# Patient Record
Sex: Male | Born: 1990 | Race: Black or African American | Hispanic: No | Marital: Single | Smoking: Current every day smoker
Health system: Southern US, Community
[De-identification: ages and names within clinical notes are randomized; demographics above are authoritative.]

---
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
## 2019-08-27 ENCOUNTER — Other Ambulatory Visit: Payer: Self-pay

## 2019-08-27 DIAGNOSIS — Z20822 Contact with and (suspected) exposure to covid-19: Secondary | ICD-10-CM

## 2019-08-28 LAB — NOVEL CORONAVIRUS, NAA: SARS-CoV-2, NAA: NOT DETECTED

## 2021-07-06 ENCOUNTER — Encounter (HOSPITAL_BASED_OUTPATIENT_CLINIC_OR_DEPARTMENT_OTHER): Payer: Self-pay

## 2021-07-06 ENCOUNTER — Emergency Department (HOSPITAL_BASED_OUTPATIENT_CLINIC_OR_DEPARTMENT_OTHER): Payer: Self-pay | Admitting: Radiology

## 2021-07-06 ENCOUNTER — Emergency Department (HOSPITAL_BASED_OUTPATIENT_CLINIC_OR_DEPARTMENT_OTHER)
Admission: EM | Admit: 2021-07-06 | Discharge: 2021-07-06 | Disposition: A | Discharge status: Home / Self Care | Payer: Self-pay | Attending: Emergency Medicine | Admitting: Emergency Medicine

## 2021-07-06 ENCOUNTER — Other Ambulatory Visit: Payer: Self-pay

## 2021-07-06 DIAGNOSIS — U071 COVID-19: Secondary | ICD-10-CM | POA: Insufficient documentation

## 2021-07-06 DIAGNOSIS — R000 Tachycardia, unspecified: Secondary | ICD-10-CM | POA: Insufficient documentation

## 2021-07-06 DIAGNOSIS — R451 Restlessness and agitation: Secondary | ICD-10-CM | POA: Insufficient documentation

## 2021-07-06 DIAGNOSIS — F17210 Nicotine dependence, cigarettes, uncomplicated: Secondary | ICD-10-CM | POA: Insufficient documentation

## 2021-07-06 LAB — CBC WITH DIFFERENTIAL/PLATELET
Abs Immature Granulocytes: 0.01 10*3/uL (ref 0.00–0.07)
Basophils Absolute: 0 10*3/uL (ref 0.0–0.1)
Basophils Relative: 1 %
Eosinophils Absolute: 0 10*3/uL (ref 0.0–0.5)
Eosinophils Relative: 0 %
HCT: 46.9 % (ref 39.0–52.0)
Hemoglobin: 15.9 g/dL (ref 13.0–17.0)
Immature Granulocytes: 0 %
Lymphocytes Relative: 10 %
Lymphs Abs: 0.6 10*3/uL — ABNORMAL LOW (ref 0.7–4.0)
MCH: 27.2 pg (ref 26.0–34.0)
MCHC: 33.9 g/dL (ref 30.0–36.0)
MCV: 80.3 fL (ref 80.0–100.0)
Monocytes Absolute: 1.3 10*3/uL — ABNORMAL HIGH (ref 0.1–1.0)
Monocytes Relative: 24 %
Neutro Abs: 3.5 10*3/uL (ref 1.7–7.7)
Neutrophils Relative %: 65 %
Platelets: 180 10*3/uL (ref 150–400)
RBC: 5.84 MIL/uL — ABNORMAL HIGH (ref 4.22–5.81)
RDW: 13.7 % (ref 11.5–15.5)
WBC: 5.3 10*3/uL (ref 4.0–10.5)
nRBC: 0 % (ref 0.0–0.2)

## 2021-07-06 LAB — TROPONIN I (HIGH SENSITIVITY): Troponin I (High Sensitivity): 3 ng/L (ref <18)

## 2021-07-06 LAB — URINALYSIS, ROUTINE W REFLEX MICROSCOPIC
Bilirubin Urine: NEGATIVE
Glucose, UA: NEGATIVE mg/dL
Hgb urine dipstick: NEGATIVE
Ketones, ur: NEGATIVE mg/dL
Leukocytes,Ua: NEGATIVE
Nitrite: NEGATIVE
Specific Gravity, Urine: 1.027 (ref 1.005–1.030)
pH: 7 (ref 5.0–8.0)

## 2021-07-06 LAB — RAPID URINE DRUG SCREEN, HOSP PERFORMED
Amphetamines: NOT DETECTED
Barbiturates: NOT DETECTED
Benzodiazepines: NOT DETECTED
Cocaine: NOT DETECTED
Opiates: NOT DETECTED
Tetrahydrocannabinol: POSITIVE — AB

## 2021-07-06 LAB — COMPREHENSIVE METABOLIC PANEL
ALT: 13 U/L (ref 0–44)
AST: 16 U/L (ref 15–41)
Albumin: 4.6 g/dL (ref 3.5–5.0)
Alkaline Phosphatase: 52 U/L (ref 38–126)
Anion gap: 12 (ref 5–15)
BUN: 11 mg/dL (ref 6–20)
CO2: 25 mmol/L (ref 22–32)
Calcium: 10.1 mg/dL (ref 8.9–10.3)
Chloride: 103 mmol/L (ref 98–111)
Creatinine, Ser: 0.99 mg/dL (ref 0.61–1.24)
GFR, Estimated: >60 mL/min (ref >60)
Glucose, Bld: 108 mg/dL — ABNORMAL HIGH (ref 70–99)
Potassium: 3.5 mmol/L (ref 3.5–5.1)
Sodium: 140 mmol/L (ref 135–145)
Total Bilirubin: 0.6 mg/dL (ref 0.3–1.2)
Total Protein: 7.7 g/dL (ref 6.5–8.1)

## 2021-07-06 LAB — RESP PANEL BY RT-PCR (FLU A&B, COVID) ARPGX2
Influenza A by PCR: NEGATIVE
Influenza B by PCR: NEGATIVE
SARS Coronavirus 2 by RT PCR: POSITIVE — AB

## 2021-07-06 LAB — D-DIMER, QUANTITATIVE: D-Dimer, Quant: 0.33 ug/mL-FEU (ref 0.00–0.50)

## 2021-07-06 MED ORDER — ALBUTEROL SULFATE HFA 108 (90 BASE) MCG/ACT IN AERS
2.0000 | INHALATION_SPRAY | RESPIRATORY_TRACT | Status: DC | PRN
  Administered 2021-07-06: 2 via RESPIRATORY_TRACT
  Filled 2021-07-06: qty 6.7

## 2021-07-06 MED ORDER — LORAZEPAM 1 MG PO TABS
2.0000 mg | ORAL_TABLET | Freq: Once | ORAL | Status: AC
  Administered 2021-07-06: 2 mg via ORAL
  Filled 2021-07-06: qty 2

## 2021-07-06 NOTE — ED Notes (Signed)
Patient cursing at nurse stating "I can't mother fucking breathe I came here for help so help me and quit talking to me".  Advised we will not take verbal abuse and we are trying to coach you to slow your breathing down because that is why you are feeling the numbness and tingling.

## 2021-07-06 NOTE — ED Provider Notes (Signed)
[facility] EMERGENCY DEPT Provider Note   CSN: [id number] Arrival date & time: 07/06/21  1820     History Chief Complaint  Patient presents with   Shortness of Breath    Bobby Lang is a 30 y.o. male.  The patient states that he has pain in his right chest, and this pain is creating difficulty breathing.  He states that he is not truly short of breath but rather it is the pain that is compromising his ability to breathe.   Chest Pain Pain location:  R chest Pain quality: sharp   Pain radiates to:  Does not radiate Pain severity:  Severe Onset quality:  Sudden Duration:  3 hours Timing:  Constant Progression:  Unchanged Chronicity:  New Context: at rest   Relieved by:  Nothing Worsened by:  Deep breathing Ineffective treatments:  None tried Associated symptoms: shortness of breath   Associated symptoms: no abdominal pain, no back pain, no cough, no fever, no palpitations and no vomiting   Risk factors: smoking       History reviewed. No pertinent past medical history.  There are no problems to display for this patient.   History reviewed. No pertinent surgical history.     No family history on file.  Social History   Tobacco Use   Smoking status: Every Day    Types: Cigarettes   Smokeless tobacco: Never  Substance Use Topics   Alcohol use: Never   Drug use: Never    Home Medications Prior to Admission medications   Not on File    Allergies    Patient has no known allergies.  Review of Systems   Review of Systems  Constitutional:  Negative for chills and fever.  HENT:  Negative for ear pain and sore throat.   Eyes:  Negative for pain and visual disturbance.  Respiratory:  Positive for shortness of breath. Negative for cough.   Cardiovascular:  Positive for chest pain. Negative for palpitations.  Gastrointestinal:  Negative for abdominal pain and vomiting.  Genitourinary:  Negative for dysuria and hematuria.   Musculoskeletal:  Negative for arthralgias and back pain.  Skin:  Negative for color change and rash.  Neurological:  Negative for seizures and syncope.  All other systems reviewed and are negative.  Physical Exam Updated Vital Signs BP (!) 130/97 (BP Location: Right Arm)   Pulse 93   Temp 99.5 F (37.5 C)   Resp 18   SpO2 100%   Physical Exam Constitutional:      Comments: Agitated, sweaty  HENT:     Head: Normocephalic and atraumatic.     Mouth/Throat:     Mouth: Mucous membranes are moist.  Cardiovascular:     Rate and Rhythm: Regular rhythm. Tachycardia present.     Heart sounds: Normal heart sounds.  Pulmonary:     Effort: Pulmonary effort is normal.     Breath sounds: Normal breath sounds.  Abdominal:     Palpations: Abdomen is soft. There is no mass.     Tenderness: There is no abdominal tenderness.  Musculoskeletal:        General: Normal range of motion.  Skin:    General: Skin is warm.  Neurological:     General: No focal deficit present.  Psychiatric:        Behavior: Behavior is agitated.    ED Results / Procedures / Treatments   Labs (all labs ordered are listed, but only abnormal results are displayed) Labs Reviewed  RESP PANEL  BY RT-PCR (FLU A&B, COVID) ARPGX2 - Abnormal; Notable for the following components:      Result Value   SARS Coronavirus 2 by RT PCR POSITIVE (*)    All other components within normal limits  URINALYSIS, ROUTINE W REFLEX MICROSCOPIC - Abnormal; Notable for the following components:   Protein, ur TRACE (*)    All other components within normal limits  RAPID URINE DRUG SCREEN, HOSP PERFORMED - Abnormal; Notable for the following components:   Tetrahydrocannabinol POSITIVE (*)    All other components within normal limits  CBC WITH DIFFERENTIAL/PLATELET - Abnormal; Notable for the following components:   RBC 5.84 (*)    Lymphs Abs 0.6 (*)    Monocytes Absolute 1.3 (*)    All other components within normal limits   COMPREHENSIVE METABOLIC PANEL - Abnormal; Notable for the following components:   Glucose, Bld 108 (*)    All other components within normal limits  D-DIMER, QUANTITATIVE  TROPONIN I (HIGH SENSITIVITY)  TROPONIN I (HIGH SENSITIVITY)    EKG EKG Interpretation  Date/Time:  Thursday July 06 2021 18:28:09 EDT Ventricular Rate:  106 PR Interval:  181 QRS Duration: 99 QT Interval:  328 QTC Calculation: 436 R Axis:   81 Text Interpretation: Sinus tachycardia ST elev, probable normal early repol pattern normal axis No acute ischemia Confirmed by Pieter Partridge ([id number]) on 07/06/2021 6:37:57 PM  Radiology DG Chest 2 View  Result Date: 07/06/2021 CLINICAL DATA:  Shortness of breath EXAM: CHEST - 2 VIEW COMPARISON:  09/10/2008 FINDINGS: Cardiac shadow is within normal limits. The lungs are well aerated bilaterally. Diffuse interstitial changes are noted bilaterally likely representing an atypical pneumonia. No focal confluent infiltrate is seen. No sizable effusion is noted. IMPRESSION: Diffuse interstitial changes which may represent bronchitis or atypical pneumonia. Electronically Signed   By: Alcide Clever M.D.   On: 07/06/2021 19:10    Procedures Procedures   Medications Ordered in ED Medications  albuterol (VENTOLIN HFA) 108 (90 Base) MCG/ACT inhaler 2 puff (has no administration in time range)  LORazepam (ATIVAN) tablet 2 mg (has no administration in time range)    ED Course  I have reviewed the triage vital signs and the nursing notes.  Pertinent labs & imaging results that were available during my care of the patient were reviewed by me and considered in my medical decision making (see chart for details).    MDM Rules/Calculators/A&P                           Bobby Lang presented complaining of right-sided chest pain and difficulty breathing.  He was quite anxious upon arrival and was also agitated and angry.  Initial differential diagnosis included pneumothorax, pneumonia,  sympathomimetic toxicity or other toxin, ACS.  He was given Ativan with good response.  X-ray revealed bilateral interstitial pneumonia at which point I added a COVID-19 test.  This was positive, and this is likely the explanation for his symptoms.  He is young, healthy, and at low risk for serious complications.  He does not meet criteria for Paxlovid treatment.  Return precautions given. Final Clinical Impression(s) / ED Diagnoses Final diagnoses:  COVID-19    Rx / DC Orders ED Discharge Orders     None        Koleen Distance, MD 07/06/21 2105

## 2021-07-06 NOTE — ED Notes (Signed)
This RN was called to Room concerning Patient wishing to leave without completing care. Patient made aware that they are advised to wait and complete care and be informed by EDP concerning Results. Patient states he will think about it. Staff will continue to monitor.

## 2021-07-06 NOTE — ED Triage Notes (Signed)
Patient walked into the lobby stating "I can't breathe" and then threw himself in the floor.  Advised patient he is hyperventilating and patient continues to get angry and state "it hurts to breathe". Patient complaining of pain with inspiration so hyperventilating causing numbness to extremities and face.

## 2022-10-23 IMAGING — DX DG CHEST 2V
2 series · 2 of 2 positions shown · non-contrast
Comparison: 09/10/2008

CLINICAL DATA: Shortness of breath

EXAM:
CHEST - 2 VIEW

[chest pa]
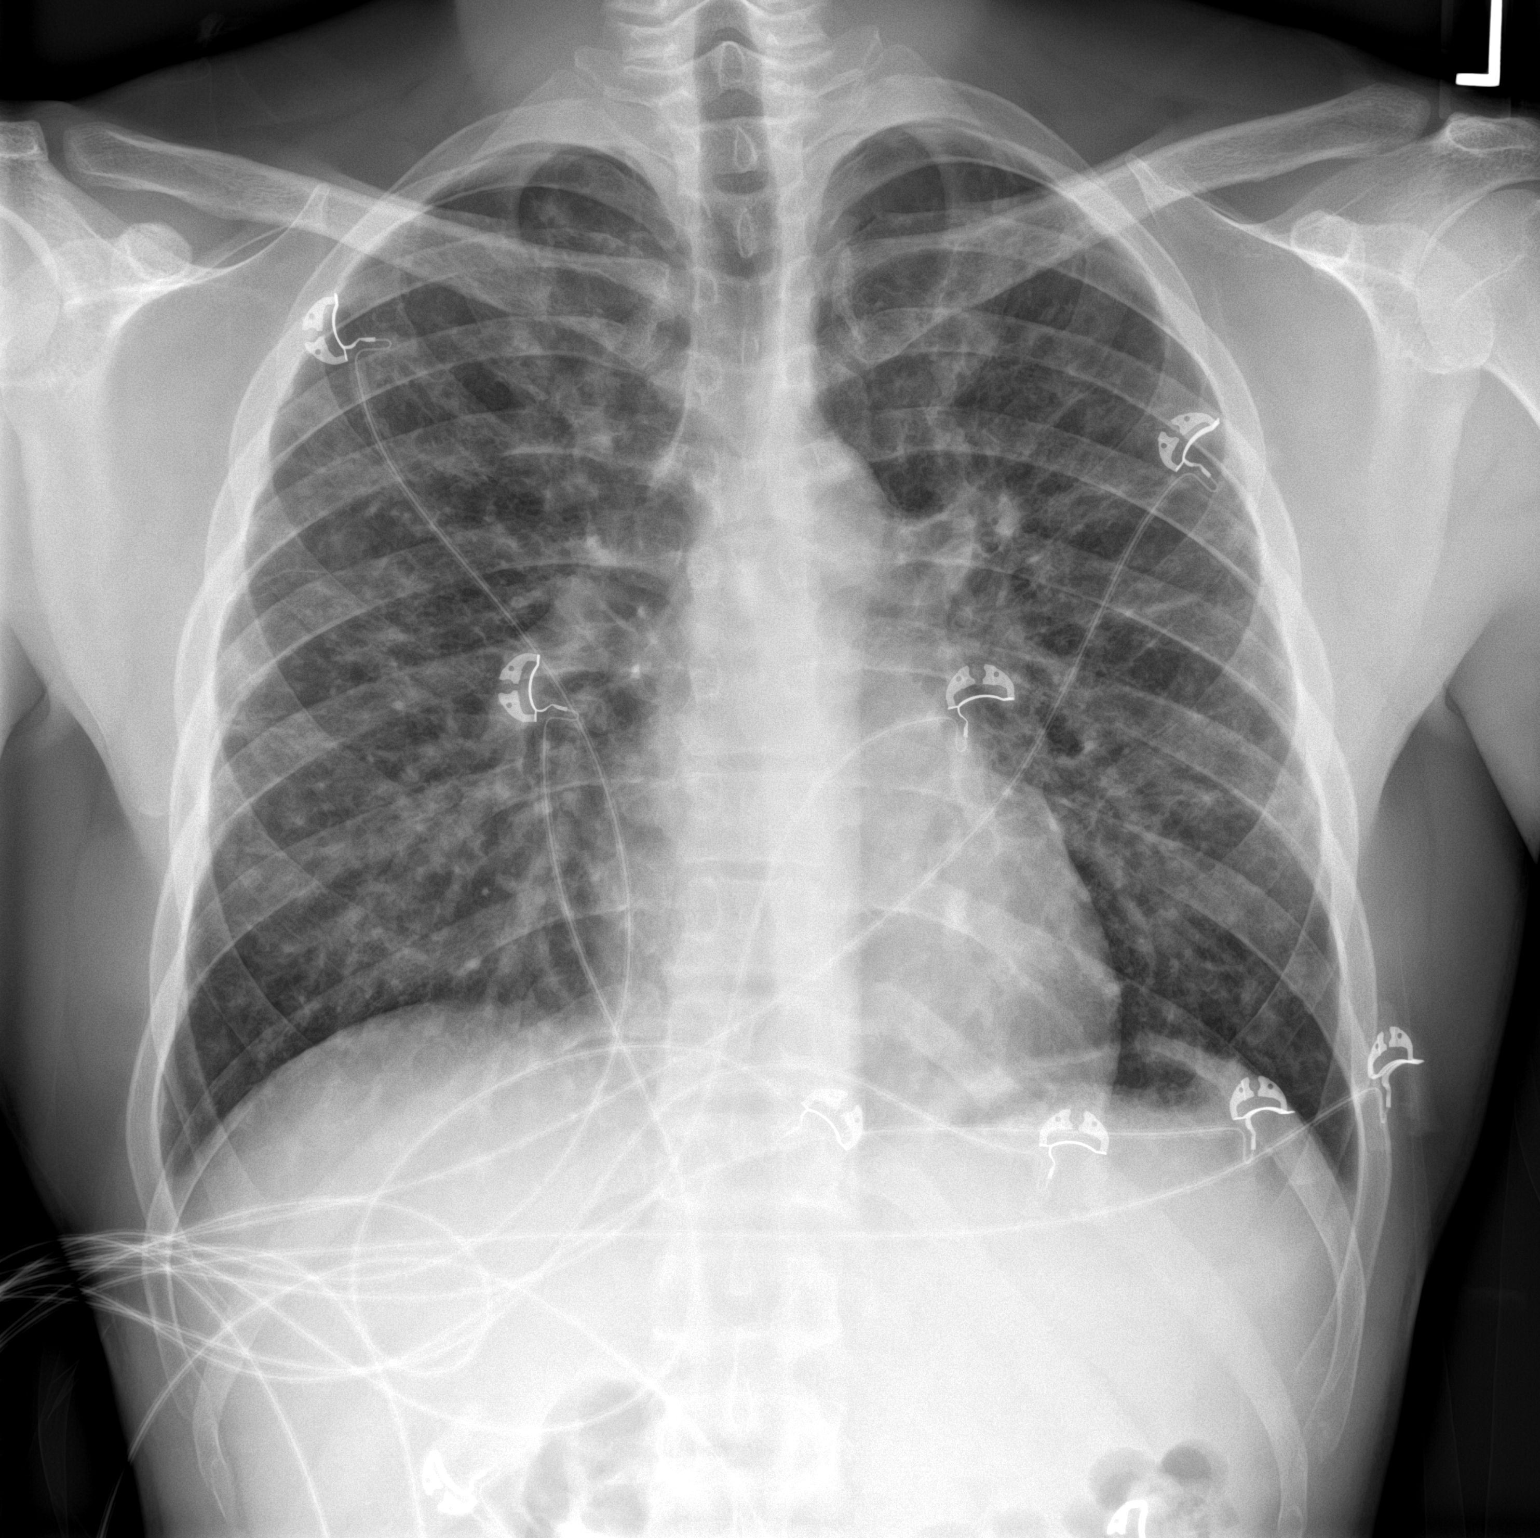

[chest lat]
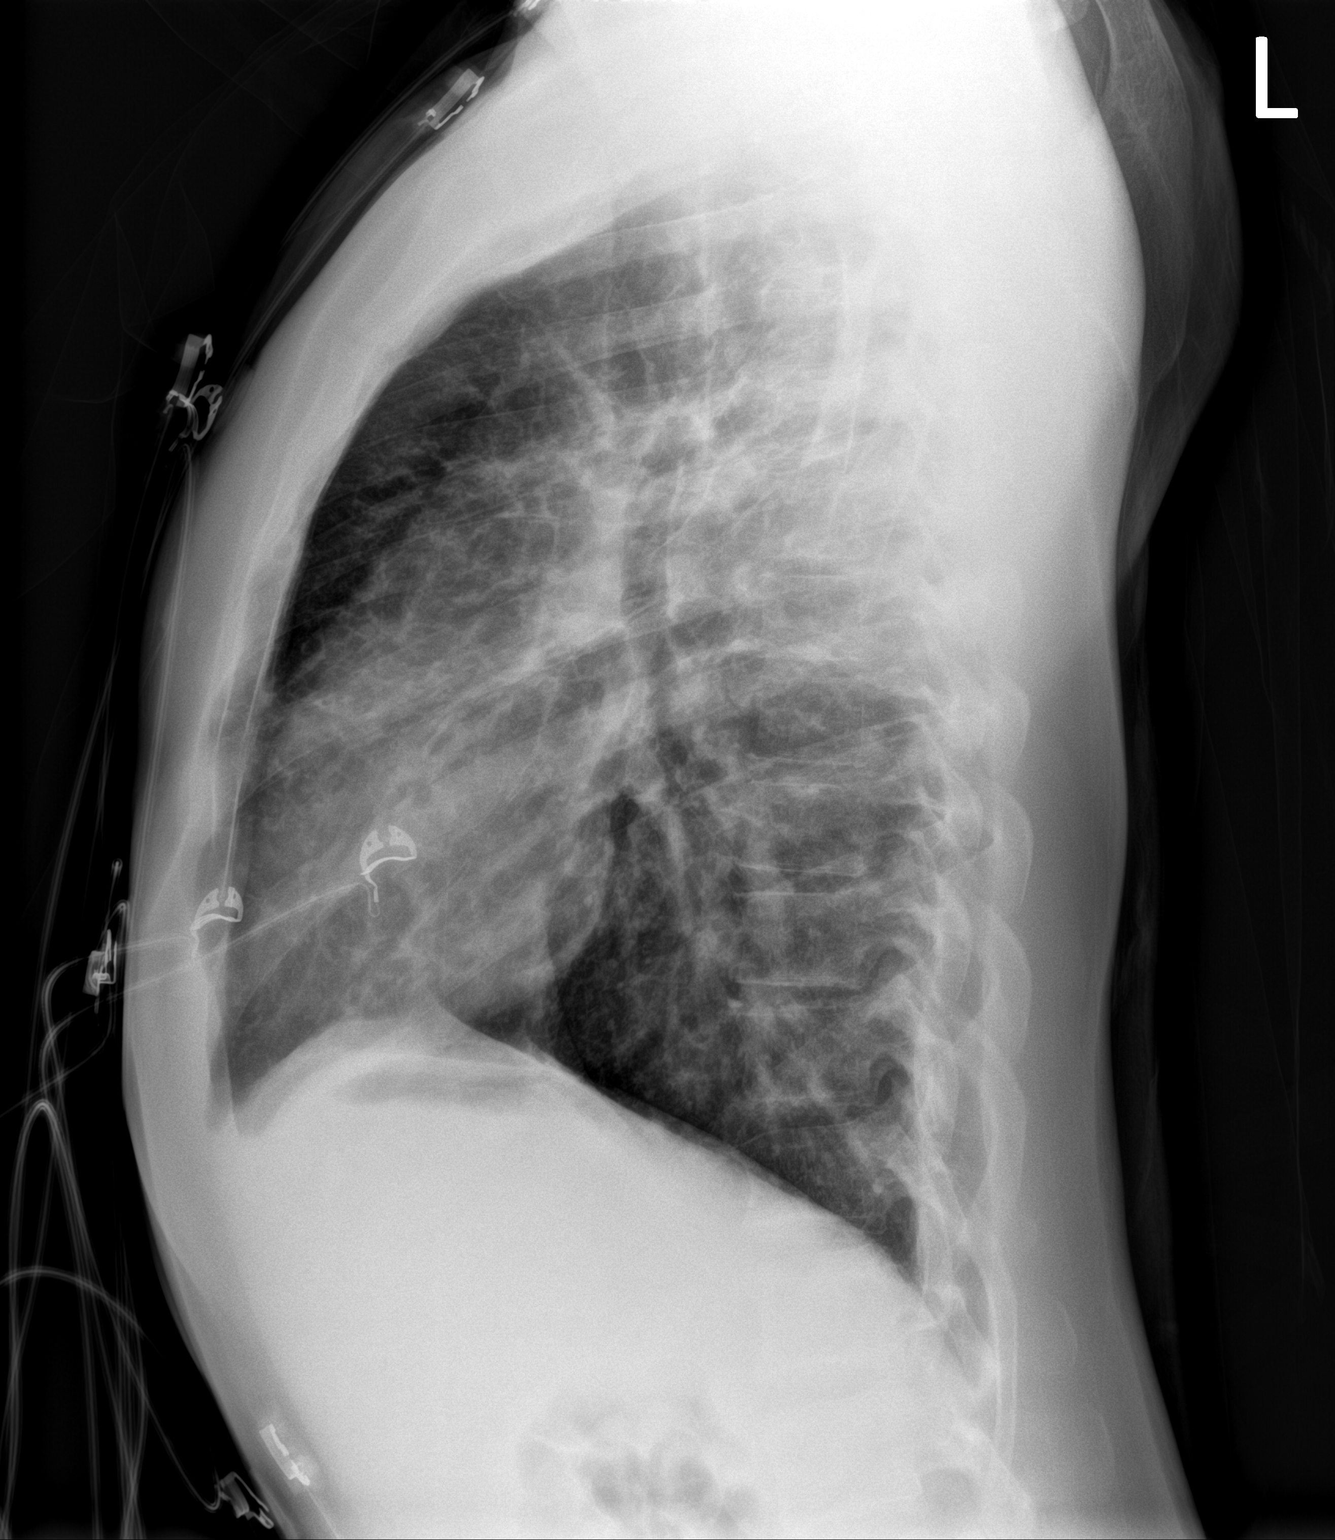

[2 of 2 positions shown; findings below may reference images not displayed]

FINDINGS: Cardiac shadow is within normal limits. The lungs are well aerated
bilaterally. Diffuse interstitial changes are noted bilaterally
likely representing an atypical pneumonia. No focal confluent
infiltrate is seen. No sizable effusion is noted.
IMPRESSION: Diffuse interstitial changes which may represent bronchitis or
atypical pneumonia.

## 2024-01-14 ENCOUNTER — Ambulatory Visit: Payer: Self-pay | Admitting: Family Medicine

## 2024-01-28 ENCOUNTER — Ambulatory Visit: Payer: Self-pay | Admitting: Family Medicine

## 2024-02-06 ENCOUNTER — Ambulatory Visit: Payer: Self-pay | Admitting: Family Medicine

## 2024-03-19 ENCOUNTER — Telehealth: Payer: Self-pay | Admitting: Family Medicine

## 2024-03-19 ENCOUNTER — Ambulatory Visit: Payer: Self-pay | Admitting: Family Medicine

## 2024-03-19 NOTE — Telephone Encounter (Signed)
 Copied from CRM [id number]. Topic: Appointments - Scheduling Inquiry for Clinic >> Mar 19, 2024  3:11 PM Bearl Botts E wrote: Reason for CRM: Pt's wife Astou Aw called reporting that she wants the patient to be reconsidered for new patient scheduling with Dr. Courtland Ditch. She says they had a family emergency and have been trying to call to reschedule prior to the appt time. She also says that she is a patient of Dr. Courtland Ditch, her mother Orlin Black is also and that Dr. Courtland Ditch "will lose all three patients because we want to come as a family to the same doctor."  Best contact: [phone]

## 2024-03-19 NOTE — Telephone Encounter (Signed)
 Patient has canceled multiple appointments and no showed 2 appointments with Dr. Courtland Ditch. We are unable to reschedule additional appointments at this time. Left message on patients spouse voicemail.

## 2024-09-24 ENCOUNTER — Ambulatory Visit: Payer: Self-pay

## 2024-09-24 NOTE — Telephone Encounter (Signed)
 Patient answered call- advised RN that he was going to go another route and does not want to speak on this right now.  Pt to call back with concerns

## 2024-09-24 NOTE — Telephone Encounter (Signed)
 This RN attempted to reach patient to triage symptoms. VM not set up, unable to leave message at this time. Attempt #1  Copied from CRM #[id number]. Topic: Clinical - Red Word Triage >> Sep 24, 2024  5:09 PM Selinda RAMAN wrote: Red Word that prompted transfer to Nurse Triage: Astou the patients partner called stating the patient is exceedingly worried about a lump he has on his penis. He states it used to really hurt him and now he doesn't complain of pain. He has anxiety of this is a benign growth or malignant.. He does have an appointment to see a pcp on 11/25 and on the wait list. Please contact patient [phone]

## 2024-10-13 ENCOUNTER — Ambulatory Visit: Payer: Self-pay | Admitting: Family Medicine
